# Patient Record
Sex: Female | Born: 1950 | Race: White | Hispanic: No | Marital: Single | Smoking: Never smoker
Health system: Southern US, Community
[De-identification: ages and names within clinical notes are randomized; demographics above are authoritative.]

## PROBLEM LIST (undated history)

## (undated) DIAGNOSIS — M1990 Unspecified osteoarthritis, unspecified site: Secondary | ICD-10-CM

## (undated) DIAGNOSIS — I10 Essential (primary) hypertension: Secondary | ICD-10-CM

## (undated) DIAGNOSIS — G4730 Sleep apnea, unspecified: Secondary | ICD-10-CM

## (undated) DIAGNOSIS — E119 Type 2 diabetes mellitus without complications: Secondary | ICD-10-CM

## (undated) DIAGNOSIS — E7800 Pure hypercholesterolemia, unspecified: Secondary | ICD-10-CM

## (undated) HISTORY — DX: Sleep apnea, unspecified: G47.30

## (undated) HISTORY — DX: Type 2 diabetes mellitus without complications: E11.9

## (undated) HISTORY — PX: OTHER SURGICAL HISTORY: SHX169

## (undated) HISTORY — DX: Pure hypercholesterolemia, unspecified: E78.00

## (undated) HISTORY — DX: Unspecified osteoarthritis, unspecified site: M19.90

## (undated) HISTORY — DX: Essential (primary) hypertension: I10

---

## 2016-04-30 ENCOUNTER — Encounter (INDEPENDENT_AMBULATORY_CARE_PROVIDER_SITE_OTHER): Payer: Self-pay | Admitting: Internal Medicine

## 2016-05-13 ENCOUNTER — Encounter (INDEPENDENT_AMBULATORY_CARE_PROVIDER_SITE_OTHER): Payer: Self-pay | Admitting: Internal Medicine

## 2016-05-13 ENCOUNTER — Ambulatory Visit (INDEPENDENT_AMBULATORY_CARE_PROVIDER_SITE_OTHER): Payer: Medicare FFS | Admitting: Internal Medicine

## 2016-05-13 VITALS — BP 130/80 | HR 72 | Temp 98.6°F | Ht 63.0 in | Wt 198.1 lb

## 2016-05-13 DIAGNOSIS — R634 Abnormal weight loss: Secondary | ICD-10-CM | POA: Diagnosis not present

## 2016-05-13 DIAGNOSIS — B349 Viral infection, unspecified: Secondary | ICD-10-CM | POA: Diagnosis not present

## 2016-05-13 DIAGNOSIS — R748 Abnormal levels of other serum enzymes: Secondary | ICD-10-CM

## 2016-05-13 DIAGNOSIS — E119 Type 2 diabetes mellitus without complications: Secondary | ICD-10-CM | POA: Insufficient documentation

## 2016-05-13 DIAGNOSIS — I10 Essential (primary) hypertension: Secondary | ICD-10-CM | POA: Insufficient documentation

## 2016-05-13 DIAGNOSIS — E7800 Pure hypercholesterolemia, unspecified: Secondary | ICD-10-CM | POA: Diagnosis not present

## 2016-05-13 LAB — CBC WITH DIFFERENTIAL/PLATELET
BASOS PCT: 1 %
Basophils Absolute: 84 cells/uL (ref 0–200)
Eosinophils Absolute: 252 cells/uL (ref 15–500)
Eosinophils Relative: 3 %
HCT: 39.4 % (ref 35.0–45.0)
Hemoglobin: 13.2 g/dL (ref 11.7–15.5)
LYMPHS PCT: 26 %
Lymphs Abs: 2184 cells/uL (ref 850–3900)
MCH: 29.5 pg (ref 27.0–33.0)
MCHC: 33.5 g/dL (ref 32.0–36.0)
MCV: 88.1 fL (ref 80.0–100.0)
MONOS PCT: 7 %
MPV: 10.1 fL (ref 7.5–12.5)
Monocytes Absolute: 588 cells/uL (ref 200–950)
NEUTROS PCT: 63 %
Neutro Abs: 5292 cells/uL (ref 1500–7800)
PLATELETS: 254 10*3/uL (ref 140–400)
RBC: 4.47 MIL/uL (ref 3.80–5.10)
RDW: 13.9 % (ref 11.0–15.0)
WBC: 8.4 10*3/uL (ref 3.8–10.8)

## 2016-05-13 NOTE — Progress Notes (Signed)
Subjective:    Patient ID: Nancy Velazquez, female    DOB: 08-03-1951, 65 y.o.   MRN: [id number]  HPI Referred by Dr. Hazle Quant for weight loss.  She says she started losing weight about a month ago. She had nausea after eating. She had some diarrhea. There is no diarrhea now. The diarrhea lasted for a couple of weeks on and off. She tells me she was covered with an antibiotic but could not tell me what it was.   She says she lost about 10 pounds. She says she has put on about 4 pounds that she lost.   Her symptoms have all resolved at this time.  She is 100% better at this. She is having a BM x 1 a day.  No melena or BRRB. No abdominal pain.  No acid reflux.  Slightly elevated liver enzymes  05/02/2016 CT abdomen/pelvis with CM: Diverticulosis without acute inflammation and no acute intraabdominal pelvic pathology ,. 04/08/2014 colonoscopy (weight loss): Dr. Aleene Davidson prior hx of cecel polyps. Normal colonoscopy. No recurrent polypoid disease.  02/09/2013 Dr. Samuella Cota:  Colonoscopy with Biopsy: Colon polyps (sessile 8-48mm removed from cecum with cold snare. Two papular polyps were biopsied from the cecum.  Biopsy: Adenomatous polyps. Patient stated she did not undergo this colonoscopy in 2014 with Dr. Samuella Cota.   03/18/2016 H and H 13,8 and 40.9. AST 53, ALT 35. Total bili 0.6 Review of Systems Past Medical History:  Diagnosis Date  . Diabetes (HCC)   . High cholesterol   . Hypertension   . Osteoarthritis   . Sleep apnea     Past Surgical History:  Procedure Laterality Date  . revision of rt hip    . rt knee replacement     1980    Allergies  Allergen Reactions  . Ciprocin-Fluocin-Procin [Fluocinolone]     Trigger finger  . Keflex [Cephalexin]     swelling  . Tramadol     Itching all over    No current outpatient prescriptions on file prior to visit.   No current facility-administered medications on file prior to visit.    Current Outpatient Prescriptions  Medication Sig  Dispense Refill  . aspirin 81 MG tablet Take 81 mg by mouth daily.    Marland Kitchen glimepiride (AMARYL) 1 MG tablet Take 1 mg by mouth daily with breakfast.    . lisinopril-hydrochlorothiazide (PRINZIDE,ZESTORETIC) 20-12.5 MG tablet Take 1 tablet by mouth daily.    . meloxicam (MOBIC) 15 MG tablet Take 15 mg by mouth as needed for pain.    . metFORMIN (GLUCOPHAGE) 1000 MG tablet Take 1,000 mg by mouth 2 (two) times daily with a meal.    . omeprazole (PRILOSEC) 20 MG capsule Take 20 mg by mouth daily.    . simvastatin (ZOCOR) 40 MG tablet Take 40 mg by mouth daily.    . Cholecalciferol (VITAMIN D3) 3000 units TABS Take by mouth.     No current facility-administered medications for this visit.        Objective:   Physical Exam Blood pressure 130/80, pulse 72, temperature 98.6 F (37 C), height 5\' 3"  (1.6 m), weight 198 lb 1.6 oz (89.9 kg). Alert and oriented. Skin warm and dry. Oral mucosa is moist.   . Sclera anicteric, conjunctivae is pink. Thyroid not enlarged. No cervical lymphadenopathy. Lungs clear. Heart regular rate and rhythm.  Abdomen is soft. Bowel sounds are positive. No hepatomegaly. No abdominal masses felt. No tenderness.  No edema to lower extremities. Patient is alert and  oriented.       Assessment & Plan:  Weight loss.  Possible from a viral syndrome. However she did not have a fever. Elevated liver enzymes Will get CBC, Hepatic function today. Further recommendations to follow.

## 2016-05-13 NOTE — Patient Instructions (Addendum)
CBC and Hepatic function. OV in 6 months. Try coming off the Meloicam.

## 2016-05-14 ENCOUNTER — Encounter (INDEPENDENT_AMBULATORY_CARE_PROVIDER_SITE_OTHER): Payer: Self-pay

## 2016-05-14 LAB — HEPATIC FUNCTION PANEL
ALT: 26 U/L (ref 6–29)
AST: 37 U/L — ABNORMAL HIGH (ref 10–35)
Albumin: 4.1 g/dL (ref 3.6–5.1)
Alkaline Phosphatase: 35 U/L (ref 33–130)
BILIRUBIN DIRECT: 0.1 mg/dL (ref <=0.2)
BILIRUBIN INDIRECT: 0.4 mg/dL (ref 0.2–1.2)
TOTAL PROTEIN: 6.3 g/dL (ref 6.1–8.1)
Total Bilirubin: 0.5 mg/dL (ref 0.2–1.2)

## 2016-11-13 ENCOUNTER — Ambulatory Visit (INDEPENDENT_AMBULATORY_CARE_PROVIDER_SITE_OTHER): Payer: Medicare FFS | Admitting: Internal Medicine

## 2017-06-23 ENCOUNTER — Other Ambulatory Visit (HOSPITAL_COMMUNITY): Payer: Self-pay | Admitting: Interventional Radiology

## 2017-06-23 DIAGNOSIS — I729 Aneurysm of unspecified site: Secondary | ICD-10-CM

## 2017-06-30 ENCOUNTER — Ambulatory Visit (HOSPITAL_COMMUNITY)
Admission: RE | Admit: 2017-06-30 | Discharge: 2017-06-30 | Disposition: A | Discharge status: Home / Self Care | Payer: Medicare FFS | Source: Ambulatory Visit | Attending: Interventional Radiology | Admitting: Interventional Radiology

## 2017-06-30 DIAGNOSIS — I729 Aneurysm of unspecified site: Secondary | ICD-10-CM

## 2017-06-30 HISTORY — PX: IR RADIOLOGIST EVAL & MGMT: IMG5224

## 2017-07-01 ENCOUNTER — Encounter (HOSPITAL_COMMUNITY): Payer: Self-pay | Admitting: Interventional Radiology

## 2017-07-06 ENCOUNTER — Other Ambulatory Visit (HOSPITAL_COMMUNITY): Payer: Self-pay | Admitting: Interventional Radiology

## 2017-07-06 DIAGNOSIS — I729 Aneurysm of unspecified site: Secondary | ICD-10-CM

## 2017-07-08 ENCOUNTER — Other Ambulatory Visit: Payer: Self-pay | Admitting: Radiology

## 2017-07-09 ENCOUNTER — Other Ambulatory Visit: Payer: Self-pay | Admitting: Student

## 2017-07-10 ENCOUNTER — Ambulatory Visit (HOSPITAL_COMMUNITY)
Admission: RE | Admit: 2017-07-10 | Discharge: 2017-07-10 | Disposition: A | Discharge status: Home / Self Care | Payer: Medicare FFS | Source: Ambulatory Visit | Attending: Interventional Radiology | Admitting: Interventional Radiology

## 2017-07-10 ENCOUNTER — Encounter (HOSPITAL_COMMUNITY): Payer: Self-pay

## 2017-07-10 ENCOUNTER — Other Ambulatory Visit (HOSPITAL_COMMUNITY): Payer: Self-pay | Admitting: Interventional Radiology

## 2017-07-10 DIAGNOSIS — E7800 Pure hypercholesterolemia, unspecified: Secondary | ICD-10-CM | POA: Diagnosis not present

## 2017-07-10 DIAGNOSIS — M1990 Unspecified osteoarthritis, unspecified site: Secondary | ICD-10-CM | POA: Insufficient documentation

## 2017-07-10 DIAGNOSIS — E119 Type 2 diabetes mellitus without complications: Secondary | ICD-10-CM | POA: Insufficient documentation

## 2017-07-10 DIAGNOSIS — I10 Essential (primary) hypertension: Secondary | ICD-10-CM | POA: Insufficient documentation

## 2017-07-10 DIAGNOSIS — Z7982 Long term (current) use of aspirin: Secondary | ICD-10-CM | POA: Insufficient documentation

## 2017-07-10 DIAGNOSIS — Z8673 Personal history of transient ischemic attack (TIA), and cerebral infarction without residual deficits: Secondary | ICD-10-CM | POA: Insufficient documentation

## 2017-07-10 DIAGNOSIS — I729 Aneurysm of unspecified site: Secondary | ICD-10-CM

## 2017-07-10 DIAGNOSIS — Z7984 Long term (current) use of oral hypoglycemic drugs: Secondary | ICD-10-CM | POA: Diagnosis not present

## 2017-07-10 DIAGNOSIS — G4730 Sleep apnea, unspecified: Secondary | ICD-10-CM | POA: Insufficient documentation

## 2017-07-10 DIAGNOSIS — I671 Cerebral aneurysm, nonruptured: Secondary | ICD-10-CM | POA: Insufficient documentation

## 2017-07-10 HISTORY — PX: IR ANGIO INTRA EXTRACRAN SEL COM CAROTID INNOMINATE BILAT MOD SED: IMG5360

## 2017-07-10 HISTORY — PX: IR ANGIO VERTEBRAL SEL VERTEBRAL BILAT MOD SED: IMG5369

## 2017-07-10 LAB — BASIC METABOLIC PANEL
ANION GAP: 11 (ref 5–15)
BUN: 14 mg/dL (ref 6–20)
CO2: 24 mmol/L (ref 22–32)
Calcium: 9.4 mg/dL (ref 8.9–10.3)
Chloride: 100 mmol/L — ABNORMAL LOW (ref 101–111)
Creatinine, Ser: 0.63 mg/dL (ref 0.44–1.00)
GLUCOSE: 164 mg/dL — AB (ref 65–99)
POTASSIUM: 4.1 mmol/L (ref 3.5–5.1)
SODIUM: 135 mmol/L (ref 135–145)

## 2017-07-10 LAB — PROTIME-INR
INR: 1.08
Prothrombin Time: 13.9 seconds (ref 11.4–15.2)

## 2017-07-10 LAB — CBC
HEMATOCRIT: 40.9 % (ref 36.0–46.0)
HEMOGLOBIN: 14 g/dL (ref 12.0–15.0)
MCH: 29.7 pg (ref 26.0–34.0)
MCHC: 34.2 g/dL (ref 30.0–36.0)
MCV: 86.7 fL (ref 78.0–100.0)
Platelets: 281 10*3/uL (ref 150–400)
RBC: 4.72 MIL/uL (ref 3.87–5.11)
RDW: 13.3 % (ref 11.5–15.5)
WBC: 6.7 10*3/uL (ref 4.0–10.5)

## 2017-07-10 LAB — GLUCOSE, CAPILLARY
Glucose-Capillary: 168 mg/dL — ABNORMAL HIGH (ref 65–99)
Glucose-Capillary: 133 mg/dL — ABNORMAL HIGH (ref 65–99)

## 2017-07-10 LAB — APTT: APTT: 34 s (ref 24–36)

## 2017-07-10 MED ORDER — HEPARIN SODIUM (PORCINE) 1000 UNIT/ML IJ SOLN
INTRAMUSCULAR | Status: AC
  Filled 2017-07-10: qty 2

## 2017-07-10 MED ORDER — SODIUM CHLORIDE 0.9 % IV SOLN: INTRAVENOUS | Status: AC

## 2017-07-10 MED ORDER — FENTANYL CITRATE (PF) 100 MCG/2ML IJ SOLN
INTRAMUSCULAR | Status: AC | PRN
  Administered 2017-07-10: 25 ug via INTRAVENOUS

## 2017-07-10 MED ORDER — SODIUM CHLORIDE 0.9 % IV SOLN
INTRAVENOUS | Status: AC | PRN
  Administered 2017-07-10: 250 mL via INTRAVENOUS

## 2017-07-10 MED ORDER — MIDAZOLAM HCL 2 MG/2ML IJ SOLN
INTRAMUSCULAR | Status: AC
  Filled 2017-07-10: qty 2

## 2017-07-10 MED ORDER — MIDAZOLAM HCL 2 MG/2ML IJ SOLN
INTRAMUSCULAR | Status: AC | PRN
  Administered 2017-07-10: 1 mg via INTRAVENOUS

## 2017-07-10 MED ORDER — LIDOCAINE HCL (PF) 1 % IJ SOLN
INTRAMUSCULAR | Status: AC
  Filled 2017-07-10: qty 30

## 2017-07-10 MED ORDER — LIDOCAINE HCL (PF) 1 % IJ SOLN
INTRAMUSCULAR | Status: AC | PRN
  Administered 2017-07-10: 10 mL

## 2017-07-10 MED ORDER — IOPAMIDOL (ISOVUE-300) INJECTION 61%
INTRAVENOUS | Status: AC
  Administered 2017-07-10: 15 mL
  Filled 2017-07-10: qty 150

## 2017-07-10 MED ORDER — HEPARIN SODIUM (PORCINE) 1000 UNIT/ML IJ SOLN
INTRAMUSCULAR | Status: AC | PRN
  Administered 2017-07-10: 1000 [IU] via INTRAVENOUS

## 2017-07-10 MED ORDER — FENTANYL CITRATE (PF) 100 MCG/2ML IJ SOLN
INTRAMUSCULAR | Status: AC
  Filled 2017-07-10: qty 2

## 2017-07-10 MED ORDER — SODIUM CHLORIDE 0.9 % IV SOLN: Freq: Once | INTRAVENOUS | Status: DC

## 2017-07-10 MED ORDER — IOPAMIDOL (ISOVUE-300) INJECTION 61%
INTRAVENOUS | Status: AC
  Administered 2017-07-10: 70 mL
  Filled 2017-07-10: qty 50

## 2017-07-10 NOTE — Sedation Documentation (Signed)
Sheath to left groin removed. V-Pad applied. 

## 2017-07-10 NOTE — Procedures (Signed)
S/P 4 vessel cerebral  Arteriogram. RTCFA approach. Findings. 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

## 2017-07-10 NOTE — Discharge Instructions (Addendum)
Outpatient Metformin Instructions (Glucophage, Glucovance, Fortamet, Riomet, Metaglip, Glumetza, Actoplus met  Avandamet, Janumet)   Patient: Nancy Velazquez                                                07/10/2017:    Radiology Exam:     As part of your exam today in the Radiology Department, you were given a radiographic contrast material or x-ray dye.  Because you have had this contrast material and you are taking a Metformin drug (Glucophage, Glucovance, Avandamet, Fortamet, Riomet, Metaglip, Glumetza, Actoplus met, Actoplus Met XR, Prandimet or Janumet), please observe the following instructions:   DO NOT  Take this medication for 48 hours after your exam.  Because you have normal renal function and have no comorbidities, you may restart your medication in 48 hours with no need for a renal function test or consultation with your physician.  You have normal renal function but have some comorbidities.  Comorbidities include liver disease, alcohol overuse, heart failure, myocardial or muscular ischemia, sepsis, or other severe infection.  Therefore you should consult your physician before restarting your medication.  You have impaired renal function.  You should consult your physician before restarting your medication and you are advised to get a renal function test before restarting your medication.  Please discuss this with your physician.   Call your doctor before you start taking this medication again.  Your doctor may want to check your kidney function before you start taking this medication again.  I understand these instructions and have had an opportunity to discuss them with Radiology Department personnel.      Cerebral Angiogram, Care After Refer to this sheet in the next few weeks. These instructions provide you with information on caring for yourself after your procedure. Your health care provider may also give you more specific instructions. Your treatment has  been planned according to current medical practices, but problems sometimes occur. Call your health care provider if you have any problems or questions after your procedure. What can I expect after the procedure? After your procedure, it is typical to have the following:  Bruising at the catheter insertion site that usually fades within 1-2 weeks.  Blood collecting in the tissue (hematoma) that may be painful to the touch. It should usually decrease in size and tenderness within 1-2 weeks.  A mild headache.  Follow these instructions at home:  Take medicines only as directed by your health care provider.  You may shower 24-48 hours after the procedure or as directed by your health care provider. Remove the bandage (dressing) and gently wash the site with plain soap and water. Pat the area dry with a clean towel. Do not rub the site, because this may cause bleeding.  Do not take baths, swim, or use a hot tub until your health care provider approves.  Check your insertion site every day for redness, swelling, or drainage.  Do not apply powder or lotion to the site.  Do not lift over 10 lb (4.5 kg) for 5 days after your procedure or as directed by your health care provider.  Ask your health care provider when it is okay to: ? Return to work or school. ? Resume usual physical activities or sports. ? Resume sexual activity.  Do not drive home if you are discharged the same day  as the procedure. Have someone else drive you.  You may drive 24 hours after the procedure unless otherwise instructed by your health care provider.  Do not operate machinery or power tools for 24 hours after the procedure or as directed by your health care provider.  If your procedure was done as an outpatient procedure, which means that you went home the same day as your procedure, a responsible adult should be with you for the first 24 hours after you arrive home.  Keep all follow-up visits as directed by  your health care provider. This is important. Contact a health care provider if:  You have a fever.  You have chills.  You have increased bleeding from the catheter insertion site. Hold pressure on the site. Get help right away if:  You have vision changes or loss of vision.  You have numbness or weakness on one side of your body.  You have difficulty talking, or you have slurred speech or cannot speak (aphasia).  You feel confused or have difficulty remembering.  You have unusual pain at the catheter insertion site.  You have redness, warmth, or swelling at the catheter insertion site.  You have drainage (other than a small amount of blood on the dressing) from the catheter insertion site.  The catheter insertion site is bleeding, and the bleeding does not stop after 30 minutes of holding steady pressure on the site. These symptoms may represent a serious problem that is an emergency. Do not wait to see if the symptoms will go away. Get medical help right away. Call your local emergency services (911 in [place] Do not drive yourself to the hospital. This information is not intended to replace advice given to you by your health care provider. Make sure you discuss any questions you have with your health care provider. Document Released: 02/20/2014 Document Revised: 03/13/2016 Document Reviewed: 10/19/2013 Elsevier Interactive Patient Education  2017 [employer]. Moderate Conscious Sedation, Adult, Care After These instructions provide you with information about caring for yourself after your procedure. Your health care provider may also give you more specific instructions. Your treatment has been planned according to current medical practices, but problems sometimes occur. Call your health care provider if you have any problems or questions after your procedure. What can I expect after the procedure? After your procedure, it is common:  To feel sleepy for several hours.  To feel  clumsy and have poor balance for several hours.  To have poor judgment for several hours.  To vomit if you eat too soon.  Follow these instructions at home: For at least 24 hours after the procedure:   Do not: ? Participate in activities where you could fall or become injured. ? Drive. ? Use heavy machinery. ? Drink alcohol. ? Take sleeping pills or medicines that cause drowsiness. ? Make important decisions or sign legal documents. ? Take care of children on your own.  Rest. Eating and drinking  Follow the diet recommended by your health care provider.  If you vomit: ? Drink water, juice, or soup when you can drink without vomiting. ? Make sure you have little or no nausea before eating solid foods. General instructions  Have a responsible adult stay with you until you are awake and alert.  Take over-the-counter and prescription medicines only as told by your health care provider.  If you smoke, do not smoke without supervision.  Keep all follow-up visits as told by your health care provider. This is important. Contact  a health care provider if:  You keep feeling nauseous or you keep vomiting.  You feel light-headed.  You develop a rash.  You have a fever. Get help right away if:  You have trouble breathing. This information is not intended to replace advice given to you by your health care provider. Make sure you discuss any questions you have with your health care provider. Document Released: 07/27/2013 Document Revised: 03/10/2016 Document Reviewed: 01/26/2016 Elsevier Interactive Patient Education  [employer].

## 2017-07-10 NOTE — H&P (Signed)
Chief Complaint: Patient was seen in consultation today for cerebral arteriogram at the request of Dr Nancy Velazquez  Referring Physician(s): Dr Nancy Velazquez  Supervising Physician: Nancy Velazquez  Patient Status: [facility] - Out-pt  History of Present Illness: Nancy Velazquez is a 66 y.o. female   2 month gradual onset left sided weakness Hand and arm mostly Denies N/V; Dizziness Denies headache; speech or visual changes  CTA 06/08/17- outside facility shows Left internal carotid artery aneurysm Referred to Dr Nancy Velazquez Note 06/20/17: Demonstrated is fusiform dilatation of the left internal carotid artery and a supraclinoid segment which apparently measures approximately 4.3 mm in its widest dimension and extending in length of up to 2 cm. Apparent stenosis is noted of the left internal carotid artery terminus. The patient and her niece were informed that the fusiform dilatation of the left internal carotid artery intracranially most likely is sequela of chronic hypertension. The fusiform aneurysms do not rupture as a rule but they may increase in size over a period of time in terms of the diameter becoming more ectatic and to cause compressive symptoms. They could also induce microemboli formation due to the slow flow within these large fusiform aneurysms resulting in TIA like symptoms or even a prominent neurological deficit  Recommends cerebral arteriogram for full evaluation  Past Medical History:  Diagnosis Date  . Diabetes (HCC)   . High cholesterol   . Hypertension   . Osteoarthritis   . Sleep apnea     Past Surgical History:  Procedure Laterality Date  . IR RADIOLOGIST EVAL & MGMT  06/30/2017  . revision of rt hip    . rt knee replacement     1980    Allergies: Ciprocin-fluocin-procin [fluocinolone]; Keflex [cephalexin]; and Tramadol  Medications: Prior to Admission medications   Medication Sig Start Date End Date Taking? Authorizing Provider  aspirin 81  MG tablet Take 81 mg by mouth every evening.    Yes [provider]  Calcium Carb-Cholecalciferol (CALCIUM 600 + D PO) Take 1 tablet by mouth every other day.   Yes [provider]  cholecalciferol (VITAMIN D) 1000 units tablet Take 1 tablet by mouth daily.    Yes [provider]  fexofenadine (ALLEGRA) 180 MG tablet Take 180 mg by mouth daily.   Yes [provider]  glimepiride (AMARYL) 2 MG tablet Take 2 mg by mouth daily with breakfast.    Yes [provider]  lisinopril-hydrochlorothiazide (PRINZIDE,ZESTORETIC) 20-12.5 MG tablet Take 1 tablet by mouth daily.   Yes [provider]  Menthol, Topical Analgesic, (BLUE-EMU MAXIMUM STRENGTH EX) Apply 1 application topically daily as needed (joint pain).   Yes [provider]  metFORMIN (GLUCOPHAGE) 1000 MG tablet Take 1,000 mg by mouth 2 (two) times daily with a meal.   Yes [provider]  simvastatin (ZOCOR) 40 MG tablet Take 40 mg by mouth every evening.    Yes [provider]  Tetrahydrozoline-Zn Sulfate (ALLERGY RELIEF EYE DROPS OP) Place 1 drop into both eyes 4 (four) times daily as needed (allergy relief).   Yes [provider]  meloxicam (MOBIC) 15 MG tablet Take 15 mg by mouth daily as needed for pain.     [provider]  omeprazole (PRILOSEC) 20 MG capsule Take 20 mg by mouth daily as needed.     [provider]     History reviewed. No pertinent family history.  Social History   Social History  . Marital status: Single  Spouse name: N/A  . Number of children: N/A  . Years of education: N/A   Social History Main Topics  . Smoking status: Never Smoker  . Smokeless tobacco: Never Used  . Alcohol use No  . Drug use: No  . Sexual activity: Not Asked   Other Topics Concern  . None   Social History Narrative  . None    Review of Systems: A 12 point ROS discussed and pertinent positives are indicated in the HPI above.   All other systems are negative.  Review of Systems  Constitutional: Negative for activity change, fatigue and fever.  HENT: Negative for tinnitus and trouble swallowing.   Eyes: Negative for visual disturbance.  Respiratory: Negative for cough and shortness of breath.   Cardiovascular: Negative for chest pain.  Gastrointestinal: Negative for abdominal pain.  Musculoskeletal: Positive for gait problem. Negative for back pain.  Neurological: Positive for weakness. Negative for dizziness, tremors, seizures, syncope, facial asymmetry, speech difficulty, light-headedness, numbness and headaches.  Psychiatric/Behavioral: Negative for behavioral problems and confusion.    Vital Signs: BP 126/64   Pulse 91   Temp 98.9 F (37.2 C)   Ht  (1.6 m)   Wt 186 lb (84.4 kg)   SpO2 99%   BMI 32.95 kg/m   Physical Exam  Constitutional: She is oriented to person, place, and time. She appears well-nourished.  HENT:  Head: Atraumatic.  Eyes: EOM are normal.  Cardiovascular: Normal rate, regular rhythm and normal heart sounds.   Pulmonary/Chest: Effort normal and breath sounds normal.  Abdominal: Soft. Bowel sounds are normal.  Musculoskeletal: Normal range of motion.  Neurological: She is alert and oriented to person, place, and time.  Skin: Skin is warm and dry.  Psychiatric: She has a normal mood and affect. Her behavior is normal. Judgment and thought content normal.  Nursing note and vitals reviewed.   Imaging: Ir Radiologist Eval & Mgmt  Result Date: 07/01/2017 EXAM: NEW PATIENT OFFICE VISIT CHIEF COMPLAINT: Discovery of a left-sided intracranial aneurysm. 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 The patient is accompanied by her niece who is also an Charity fundraiser. According to the patient she saw a neurologist with a history of  left-sided weakness. The history was that of a 2 month gradual onset of left-sided hand weakness involving primarily the index and the thumb of the left hand. This was more noticeable because the patient is left-handed. She denied any associated symptoms of paresthesias. The symptoms apparently were interfering with her ability to grasp objects and with fine motor movements. This prompted the CT scan of the brain which according to the report revealed a left ICA aneurysm. This apparently was confirmed by CT angiogram as per report of 06/08/2017. The consult is to evaluate the management of this aneurysm. On specific questioning, the patient denies any blurring of vision, diplopia, transient blindness or of amaurosis fugax, speech difficulties, comprehension difficulties, right-sided facial droop, right-sided numbness and tingling, motor weakness, incoordination. The patient denies any symptoms of loss of consciousness or seizure-like activity. She denied difficulty swallowing liquids or solids or of perioral numbness. Her ambulation has been limited for some time now pre dating her present symptoms due to previous surgeries on her right knee. She denies recent chills, fever or rigors. Appetite is normal. Weight is steady. She denies any symptoms of UTI such as dysuria,  hematuria or of polyuria. Her appetite is normal.  Her weight is steady. Past Medical History: Arthritis, diabetes for a long time, high blood pressure, high cholesterol. Previous surgeries: Right-sided total hip replacement in 1980, and a right knee replacement in 2005. Medications: Meloxicam. Metformin. Omeprazole. Aspirin 81 mg a day. Glimepiride. Simvastatin. Zestoretic. Allergies: Keflex, Cipro, tramadol. Social History: Patient is single lives by herself independently. The patient is a retired Charity fundraiser. Denies drinking alcohol or smoking cigarettes. Family History: Abdominal aneurysms in the family. Bladder cancer, diabetes, high blood pressure.  REVIEW OF SYSTEMS: Negative unless as mentioned above. PHYSICAL EXAMINATION: The patient appears in no acute distress sitting on a chair. Affect appropriate to the situation. Neurologic examination positive for mild left hand grip weakness, apparently has mild weakness of the right foot from previous surgery. Otherwise, no gross abnormalities noted in the cranial nerves and the rest of the examination. Station and gait not tested because of the patient's arthritis difficulties. ASSESSMENT AND PLAN: The patient's CT angiogram of 06/08/2017 was reviewed on a viewing station. Grossly there appears to be wide patency of the internal carotid arteries and the vertebral arteries extracranially and intracranially. Demonstrated is fusiform dilatation of the left internal carotid artery and a supraclinoid segment which apparently measures approximately 4.3 mm in its widest dimension and extending in length of up to 2 cm. Apparent stenosis is noted of the left internal carotid artery terminus. The patient and her niece were informed that the fusiform dilatation of the left internal carotid artery intracranially most likely is sequela of chronic hypertension. The fusiform aneurysms do not rupture as a rule but they may increase in size over a period of time in terms of the diameter becoming more ectatic and to cause compressive symptoms. They could also induce microemboli formation due to the slow flow within these large fusiform aneurysms resulting in TIA like symptoms or even a prominent neurological deficit. Options in terms of management were those of continued imaging follow-up with either a CT angiogram and/or MRI MRA of the brain which would also evaluate the possibility of a silent ischemic event. The other consideration was to get a baseline accurate study in the form of a diagnostic cerebral arteriogram with subsequent follow-up either with a CT angiogram or with MRI MRA of the brain. The procedure of diagnostic  catheter arteriogram was reviewed and explained in detail. Small risk of stroke of less than 1% was informed. Questions were answered to their satisfaction. The patient was advised to further review the options and get back with [place] regarding their final decision. However, she and her niece expressed the desire to go ahead and schedule a formal diagnostic catheter arteriogram as a baseline. This will be scheduled at the earliest possible. In the meantime, the patient was advised to continue taking her medications including aspirin. Should she develop symptoms of worsening headaches, amaurosis fugax, diplopia, facial numbness, or motor, sensory or incoordination, she was asked to call [phone]. The patient and her niece leave with good understanding and agreement with the above management plan. Electronically Signed   By: Nancy Velazquez M.D.   On: 06/30/2017 19:28    Labs:  CBC:  Recent Labs  07/10/17 0921  WBC 6.7  HGB 14.0  HCT 40.9  PLT 281    COAGS:  Recent Labs  07/10/17 0921  INR 1.08  APTT 34    BMP:  Recent Labs  07/10/17 0921  NA 135  K 4.1  CL 100*  CO2 24  GLUCOSE 164*  BUN 14  CALCIUM 9.4  CREATININE 0.63  GFRNONAA >60  GFRAA >60    LIVER FUNCTION TESTS: No results for input(s): BILITOT, AST, ALT, ALKPHOS, PROT, ALBUMIN in the last 8760 hours.  TUMOR MARKERS: No results for input(s): AFPTM, CEA, CA199, CHROMGRNA in the last 8760 hours.  Assessment and Plan:  Left sided weakness -- gradual onset x 2 months CTA findings of L ICA aneurysm Now scheduled for cerebral arteriogram for full evaluation Risks and benefits of cerebral arteiogram were discussed with the patient including, but not limited to bleeding, infection, vascular injury, contrast induced renal failure, stroke or even death. This interventional procedure involves the use of X-rays and because of the nature of the planned procedure, it is possible that we will have prolonged use of X-ray  fluoroscopy. Potential radiation risks to you include (but are not limited to) the following: - A slightly elevated risk for cancer  several years later in life. This risk is typically less than 0.5% percent. This risk is low in comparison to the normal incidence of human cancer, which is 33% for women and 50% for men according to the [employer]. - Radiation induced injury can include skin redness, resembling a rash, tissue breakdown / ulcers and hair loss (which can be temporary or permanent).  The likelihood of either of these occurring depends on the difficulty of the procedure and whether you are sensitive to radiation due to previous procedures, disease, or genetic conditions.  IF your procedure requires a prolonged use of radiation, you will be notified and given written instructions for further action.  It is your responsibility to monitor the irradiated area for the 2 weeks following the procedure and to notify your physician if you are concerned that you have suffered a radiation induced injury.    All of the patient's questions were answered, patient is agreeable to proceed. Consent signed and in chart.  Thank you for this interesting consult.  I greatly enjoyed meeting [place] and look forward to participating in their care.  A copy of this report was sent to the requesting provider on this date.  Electronically Signed: Robet Leu, PA-C 07/10/2017, 11:06 AM   I spent a total of  30 Minutes   in face to face in clinical consultation, greater than 50% of which was counseling/coordinating care for cerebral arteriogram

## 2017-07-14 ENCOUNTER — Other Ambulatory Visit (HOSPITAL_COMMUNITY): Payer: Self-pay | Admitting: Interventional Radiology

## 2017-07-14 ENCOUNTER — Encounter (HOSPITAL_COMMUNITY): Payer: Self-pay | Admitting: Interventional Radiology

## 2017-07-14 DIAGNOSIS — I729 Aneurysm of unspecified site: Secondary | ICD-10-CM

## 2018-06-22 ENCOUNTER — Telehealth (HOSPITAL_COMMUNITY): Payer: Self-pay

## 2018-06-22 NOTE — Telephone Encounter (Signed)
Called to schedule f/u mra, no answer, no vm. AW 

## 2018-07-08 ENCOUNTER — Telehealth (HOSPITAL_COMMUNITY): Payer: Self-pay

## 2018-07-08 NOTE — Telephone Encounter (Signed)
Called to schedule f/u mra, no answer, no vm. AW 

## 2018-07-28 ENCOUNTER — Telehealth (HOSPITAL_COMMUNITY): Payer: Self-pay

## 2018-07-28 NOTE — Telephone Encounter (Signed)
Returned pt's phone call, no answer, no vm. AW

## 2018-08-09 ENCOUNTER — Other Ambulatory Visit (HOSPITAL_COMMUNITY): Payer: Self-pay | Admitting: Interventional Radiology

## 2018-08-09 DIAGNOSIS — I671 Cerebral aneurysm, nonruptured: Secondary | ICD-10-CM

## 2018-08-18 ENCOUNTER — Ambulatory Visit (HOSPITAL_COMMUNITY): Payer: Medicare PPO

## 2018-09-01 ENCOUNTER — Ambulatory Visit (HOSPITAL_COMMUNITY)
Admission: RE | Admit: 2018-09-01 | Discharge: 2018-09-01 | Disposition: A | Discharge status: Home / Self Care | Payer: Medicare PPO | Source: Ambulatory Visit | Attending: Interventional Radiology | Admitting: Interventional Radiology

## 2018-09-01 DIAGNOSIS — I671 Cerebral aneurysm, nonruptured: Secondary | ICD-10-CM | POA: Diagnosis not present

## 2018-09-03 ENCOUNTER — Other Ambulatory Visit (HOSPITAL_COMMUNITY): Payer: Self-pay

## 2018-09-03 ENCOUNTER — Ambulatory Visit (HOSPITAL_COMMUNITY): Payer: Medicare PPO

## 2018-09-06 ENCOUNTER — Telehealth (HOSPITAL_COMMUNITY): Payer: Self-pay

## 2018-09-06 NOTE — Telephone Encounter (Signed)
Pt agreed to f/u in 1 year with MRA wo. AW

## 2019-08-24 ENCOUNTER — Telehealth (HOSPITAL_COMMUNITY): Payer: Self-pay

## 2019-08-24 NOTE — Telephone Encounter (Signed)
Returned pt's call to let her know we are waiting on insurance auth to come back to schedule f/u. Will call once we receive it. AW

## 2019-09-07 ENCOUNTER — Other Ambulatory Visit (HOSPITAL_COMMUNITY): Payer: Self-pay | Admitting: Interventional Radiology

## 2019-09-07 DIAGNOSIS — I671 Cerebral aneurysm, nonruptured: Secondary | ICD-10-CM

## 2019-09-16 ENCOUNTER — Ambulatory Visit (HOSPITAL_COMMUNITY): Payer: Medicare PPO

## 2019-11-17 ENCOUNTER — Ambulatory Visit
Admission: RE | Admit: 2019-11-17 | Discharge: 2019-11-17 | Disposition: A | Discharge status: Home / Self Care | Payer: Self-pay | Source: Ambulatory Visit | Attending: Interventional Radiology | Admitting: Interventional Radiology

## 2019-11-17 ENCOUNTER — Other Ambulatory Visit (HOSPITAL_COMMUNITY): Payer: Self-pay | Admitting: Interventional Radiology

## 2019-11-17 DIAGNOSIS — R52 Pain, unspecified: Secondary | ICD-10-CM

## 2019-11-22 ENCOUNTER — Telehealth (HOSPITAL_COMMUNITY): Payer: Self-pay

## 2019-11-22 NOTE — Telephone Encounter (Signed)
Called pt regarding recent mri, no answer, left vm. AW 

## 2020-06-12 IMAGING — MR MR MRA HEAD W/O CM
1 series · 19 of 48 positions shown · non-contrast
Comparison: Angiography 07/10/2017

CLINICAL DATA: Followup left clinoid to supraclinoid fusiform
aneurysm.

EXAM:
MRA HEAD WITHOUT CONTRAST
TECHNIQUE: Angiographic images of the Circle of Willis were obtained using MRA
technique without intravenous contrast.

[Series 3: ax (id) 2 · axial · 1.0mm · 0.43mm/px · z∈[-19,+68]mm · 19 of 184 slices shown]
[im 1/184]
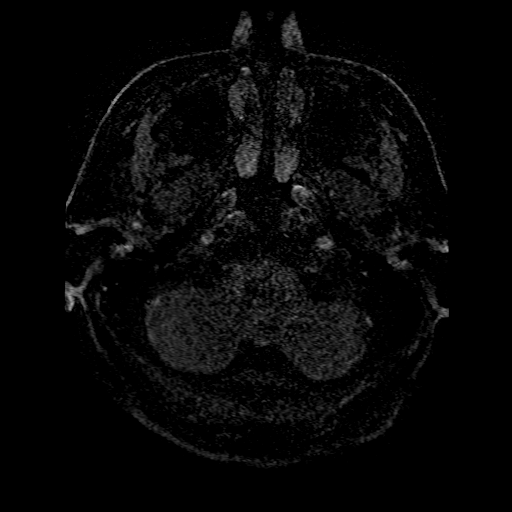
[im 4/184]
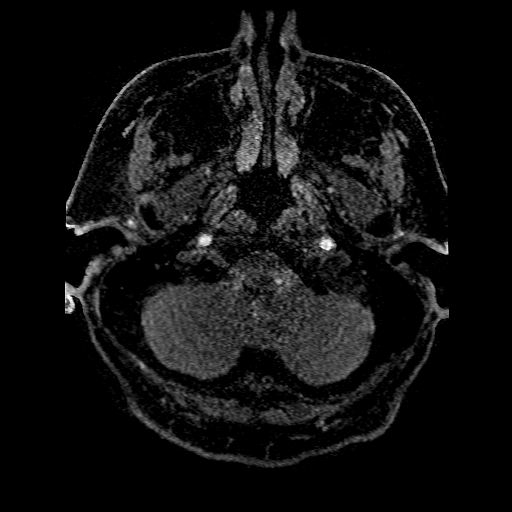
[im 8/184]
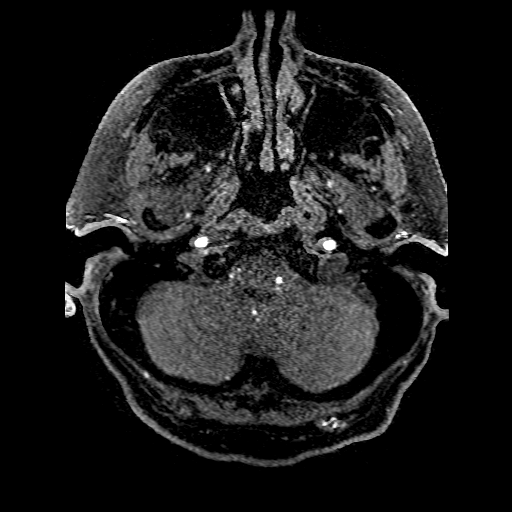
[im 12/184]
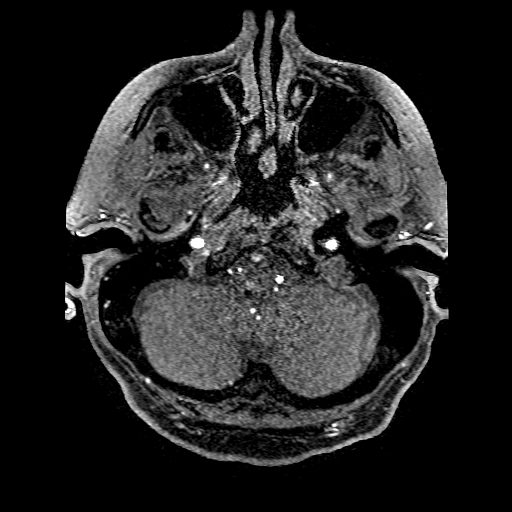
[im 16/184]
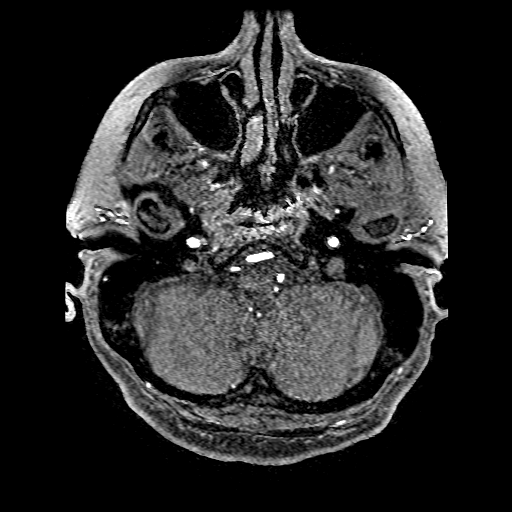
[im 20/184]
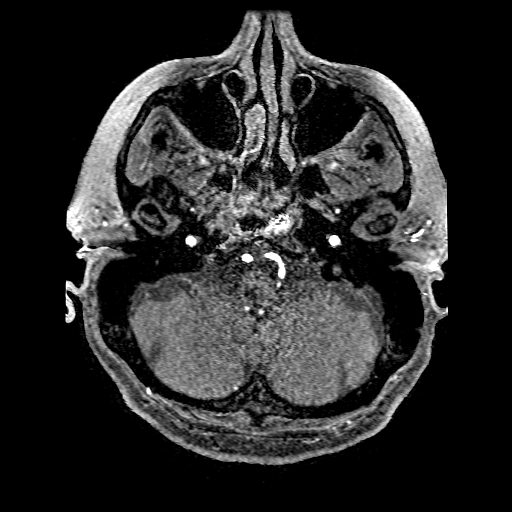
[im 24/184]
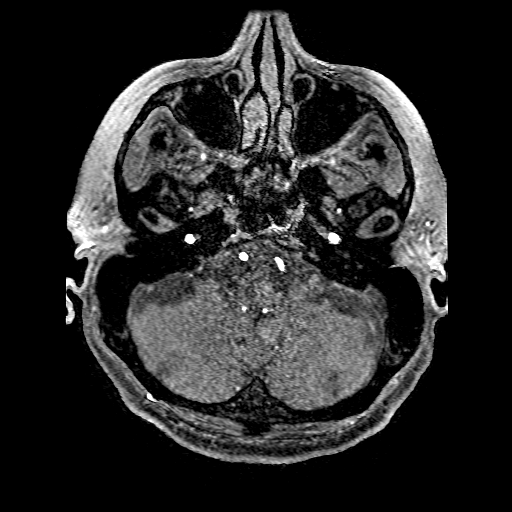
[im 28/184]
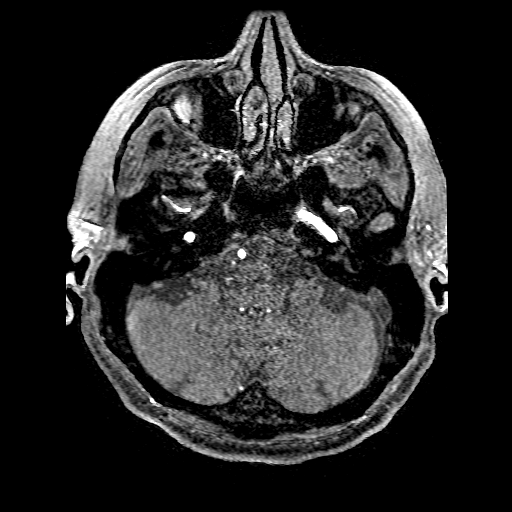
[im 32/184]
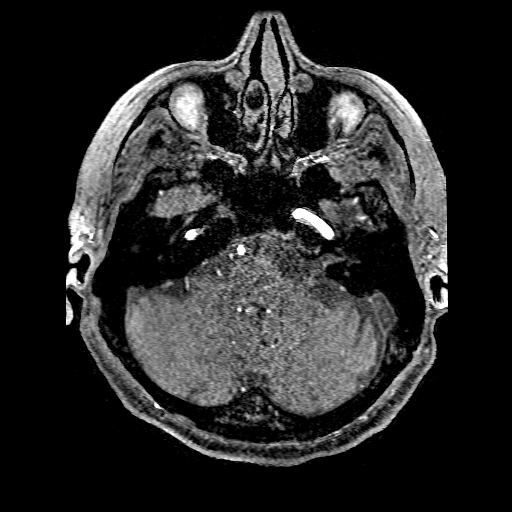
[im 36/184]
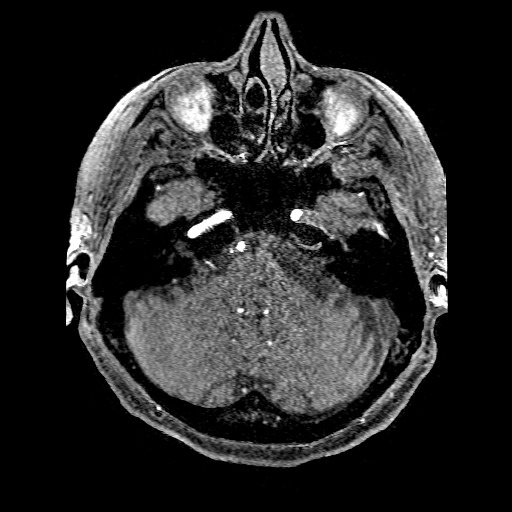
[im 39/184]
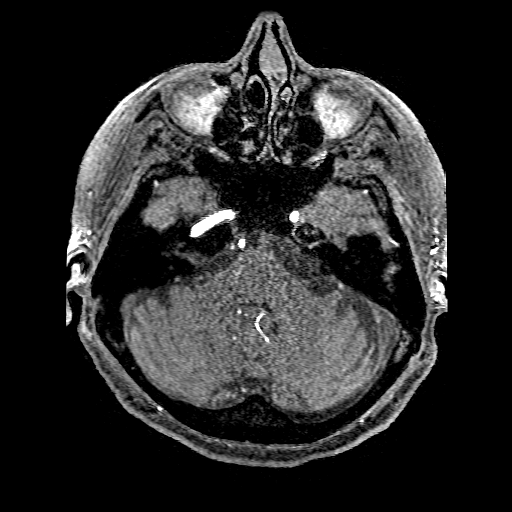
[im 59/184]
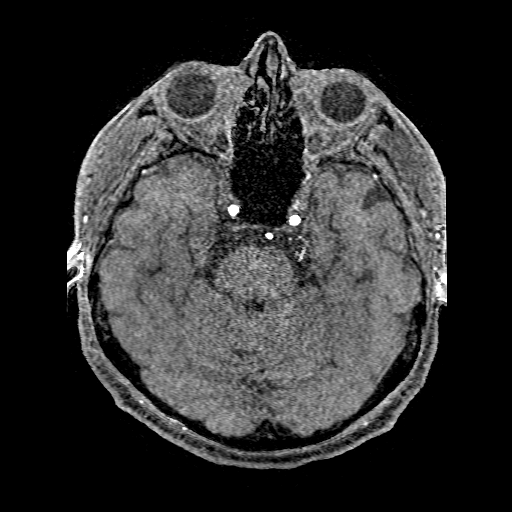
[im 82/184]
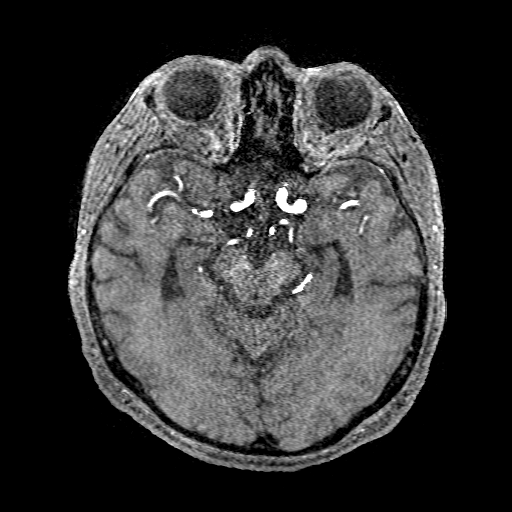
[im 94/184]
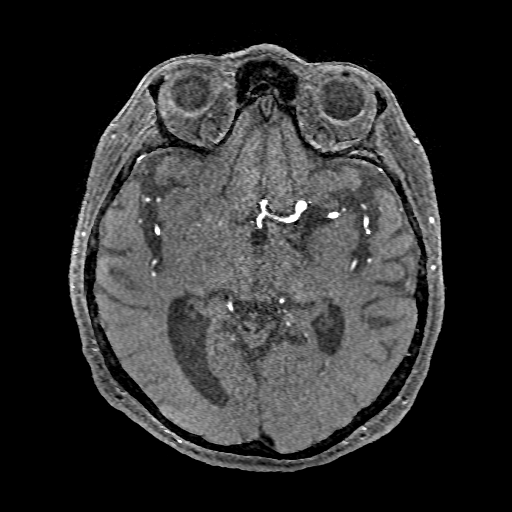
[im 106/184]
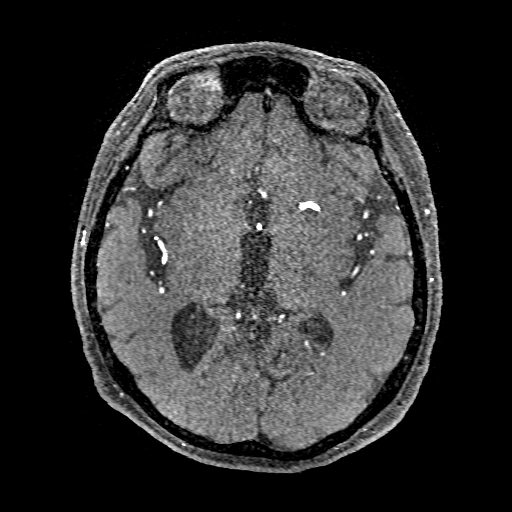
[im 129/184]
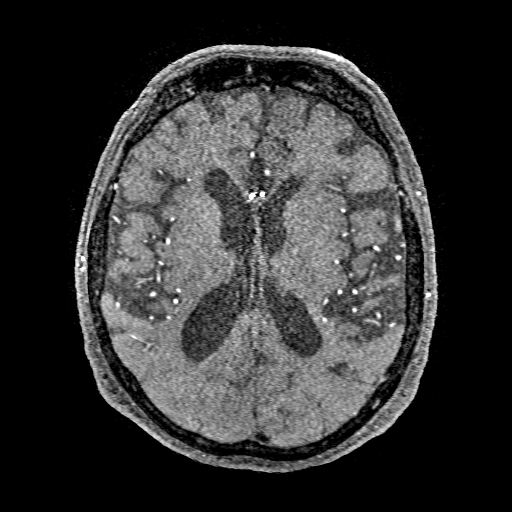
[im 152/184]
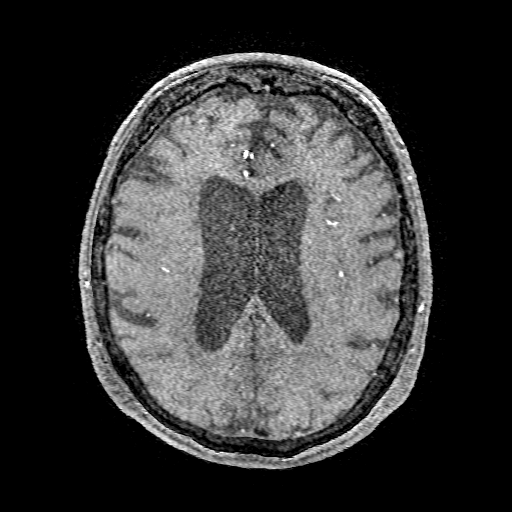
[im 156/184]
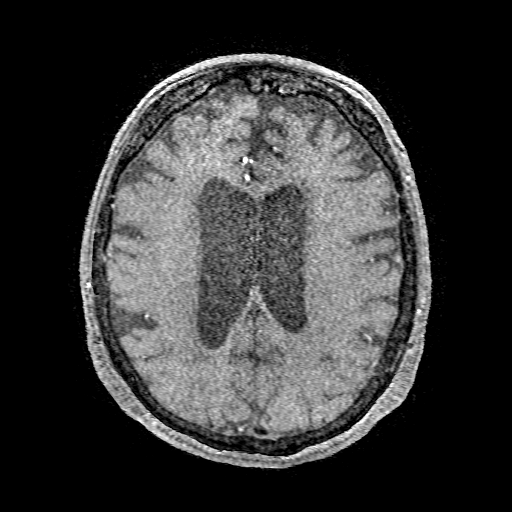
[im 176/184]
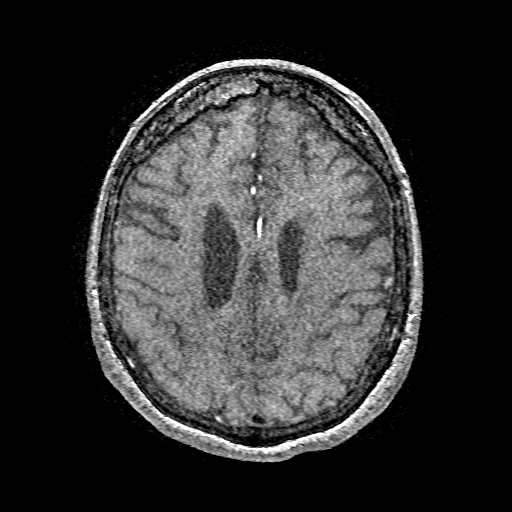

[19 of 48 positions shown; findings below may reference images not displayed]

FINDINGS: Right internal carotid artery is widely patent through the skull
base and siphon region. There is fetal origin of the right posterior
cerebral artery. Right internal carotid artery then supplies only
the right middle cerebral artery territory. Aplastic A1 segment.

Left internal carotid artery is patent through the skull base and
siphon region. As seen previously, there is fusiform aneurysmal
dilatation of the clinoid to supraclinoid ICA with diameter measured
at 5.7 mm. This vessel supplies the left middle cerebral artery
territory and both anterior cerebral artery territories. Mild
dilatation in the region of the ophthalmic artery origin on the left
is difficult to appreciate by MR angiography, but there does not
appear to be any progressive pathology in that region.

Both vertebral arteries are patent at the foramen magnum level with
the left being dominant. Both vertebral arteries supply the basilar.
No basilar stenosis. Posterior circulation branch vessels are
normal. As noted above, right PCA takes fetal origin from the
anterior circulation.
IMPRESSION: No appreciable change since the catheter angiogram June 2017.

Variant vascular anatomy as outlined above.

Fusiform dilatation of the distal clinoid and supraclinoid ICA on
the left, with maximal measured diameter of 5.7 mm in the
supraclinoid region.

## 2020-10-15 ENCOUNTER — Telehealth (HOSPITAL_COMMUNITY): Payer: Self-pay

## 2020-10-15 NOTE — Telephone Encounter (Signed)
Called to schedule f/u mra, no answer, left vm. AW 

## 2021-03-06 ENCOUNTER — Telehealth (HOSPITAL_COMMUNITY): Payer: Self-pay

## 2021-03-06 NOTE — Telephone Encounter (Signed)
Called to schedule f/u mra. Pt does not want to schedule. AW

## 2024-04-16 ENCOUNTER — Encounter (HOSPITAL_COMMUNITY): Payer: Self-pay | Admitting: Interventional Radiology

## 2024-09-19 DEATH — deceased
